# Patient Record
Sex: Male | Born: 1945 | Race: White | Hispanic: No | Marital: Married
Health system: Southern US, Community
[De-identification: ages and names within clinical notes are randomized; demographics above are authoritative.]

## PROBLEM LIST (undated history)

## (undated) DIAGNOSIS — I10 Essential (primary) hypertension: Secondary | ICD-10-CM

---

## 2021-04-18 ENCOUNTER — Emergency Department: Payer: Medicare Other

## 2021-04-18 ENCOUNTER — Other Ambulatory Visit: Payer: Self-pay

## 2021-04-18 ENCOUNTER — Emergency Department
Admission: EM | Admit: 2021-04-18 | Discharge: 2021-04-18 | Disposition: A | Discharge status: Home / Self Care | Payer: Medicare Other | Attending: Emergency Medicine | Admitting: Emergency Medicine

## 2021-04-18 DIAGNOSIS — Z7901 Long term (current) use of anticoagulants: Secondary | ICD-10-CM | POA: Diagnosis not present

## 2021-04-18 DIAGNOSIS — E860 Dehydration: Secondary | ICD-10-CM

## 2021-04-18 DIAGNOSIS — Z79899 Other long term (current) drug therapy: Secondary | ICD-10-CM | POA: Insufficient documentation

## 2021-04-18 DIAGNOSIS — I10 Essential (primary) hypertension: Secondary | ICD-10-CM | POA: Diagnosis not present

## 2021-04-18 DIAGNOSIS — R42 Dizziness and giddiness: Secondary | ICD-10-CM | POA: Diagnosis present

## 2021-04-18 DIAGNOSIS — I4891 Unspecified atrial fibrillation: Secondary | ICD-10-CM | POA: Insufficient documentation

## 2021-04-18 HISTORY — DX: Essential (primary) hypertension: I10

## 2021-04-18 LAB — BASIC METABOLIC PANEL
Anion gap: 11 (ref 5–15)
BUN: 25 mg/dL — ABNORMAL HIGH (ref 8–23)
CO2: 25 mmol/L (ref 22–32)
Calcium: 9.1 mg/dL (ref 8.9–10.3)
Chloride: 100 mmol/L (ref 98–111)
Creatinine, Ser: 1.51 mg/dL — ABNORMAL HIGH (ref 0.61–1.24)
GFR, Estimated: 48 mL/min — ABNORMAL LOW (ref >60)
Glucose, Bld: 104 mg/dL — ABNORMAL HIGH (ref 70–99)
Potassium: 4.1 mmol/L (ref 3.5–5.1)
Sodium: 136 mmol/L (ref 135–145)

## 2021-04-18 LAB — URINALYSIS, COMPLETE (UACMP) WITH MICROSCOPIC
Bacteria, UA: NONE SEEN
Bilirubin Urine: NEGATIVE
Glucose, UA: NEGATIVE mg/dL
Hgb urine dipstick: NEGATIVE
Ketones, ur: NEGATIVE mg/dL
Leukocytes,Ua: NEGATIVE
Nitrite: NEGATIVE
Protein, ur: NEGATIVE mg/dL
Specific Gravity, Urine: 1.002 — ABNORMAL LOW (ref 1.005–1.030)
Squamous Epithelial / HPF: NONE SEEN (ref 0–5)
pH: 6 (ref 5.0–8.0)

## 2021-04-18 LAB — CBC
HCT: 40.9 % (ref 39.0–52.0)
Hemoglobin: 14.3 g/dL (ref 13.0–17.0)
MCH: 33.3 pg (ref 26.0–34.0)
MCHC: 35 g/dL (ref 30.0–36.0)
MCV: 95.3 fL (ref 80.0–100.0)
Platelets: 153 10*3/uL (ref 150–400)
RBC: 4.29 MIL/uL (ref 4.22–5.81)
RDW: 12.1 % (ref 11.5–15.5)
WBC: 8.5 10*3/uL (ref 4.0–10.5)
nRBC: 0 % (ref 0.0–0.2)

## 2021-04-18 LAB — TROPONIN I (HIGH SENSITIVITY): Troponin I (High Sensitivity): 23 ng/L — ABNORMAL HIGH (ref <18)

## 2021-04-18 MED ORDER — IOHEXOL 350 MG/ML SOLN
75.0000 mL | Freq: Once | INTRAVENOUS | Status: AC | PRN
  Administered 2021-04-18: 75 mL via INTRAVENOUS

## 2021-04-18 MED ORDER — SODIUM CHLORIDE 0.9 % IV BOLUS
1000.0000 mL | Freq: Once | INTRAVENOUS | Status: AC
  Administered 2021-04-18: 1000 mL via INTRAVENOUS

## 2021-04-18 MED ORDER — APIXABAN (ELIQUIS) VTE STARTER PACK (10MG AND 5MG): ORAL_TABLET | ORAL | 0 refills | Status: AC

## 2021-04-18 MED ORDER — METOPROLOL SUCCINATE ER 25 MG PO TB24: 25.0000 mg | ORAL_TABLET | Freq: Every day | ORAL | 11 refills | Status: AC

## 2021-04-18 MED ORDER — APIXABAN (ELIQUIS) VTE STARTER PACK (10MG AND 5MG): ORAL_TABLET | ORAL | 0 refills | Status: DC

## 2021-04-18 NOTE — ED Triage Notes (Signed)
Pt comes via [facility] with c/o dizziness while playing softball. Pt states he was playing 1st base and the game had just ended. Pt states when he get dehydrated he sometimes gets blurry vision.  Pt states this also happens while practicing and playing softball.  Pt denies any CP or SOB.

## 2021-04-18 NOTE — ED Provider Notes (Signed)
[facility] Emergency Department Provider Note  ____________________________________________   Event Date/Time   First MD Initiated Contact with Patient 04/18/21 1549     (approximate)  I have reviewed the triage vital signs and the nursing notes.   HISTORY  Chief Complaint Dizziness    HPI Charles Lindsey is a 75 y.o. male presents the ED via EMS from a softball field.  Patient complains of dizziness while playing softball.  States he was playing first base in the game ended when he had a change in his vision.  States this is happened before when he is dehydrated.  States usually it will appear something like cotton and then if he is really dehydrated looks like pink cotton.  Patient states his heart rate was really high when EMS arrived, he has never had an episode of A. fib that he knows about.  Patient does have a history of carotid artery stenosis  Past Medical History:  Diagnosis Date   Hypertension     There are no problems to display for this patient.   History reviewed. No pertinent surgical history.  Prior to Admission medications   Medication Sig Start Date End Date Taking? Authorizing Provider  metoprolol succinate (TOPROL XL) 25 MG 24 hr tablet Take 1 tablet (25 mg total) by mouth daily. 04/18/21 04/18/22 Yes Olive Zmuda, Roselyn Bering, PA-C  APIXABAN Everlene Balls) VTE STARTER PACK (10MG  AND 5MG ) Take as directed on package: start with two-5mg  tablets twice daily for 7 days. On day 8, switch to one-5mg  tablet twice daily. 04/18/21   04/20/21, PA-C    Allergies Patient has no allergy information on record.  No family history on file.  Social History    Review of Systems  Constitutional: No fever/chills Eyes: No visual changes. ENT: No sore throat. Respiratory: Denies cough Cardiovascular: Denies chest pain Gastrointestinal: Denies abdominal pain Genitourinary: Negative for dysuria. Musculoskeletal: Negative for back pain. Skin: Negative  for rash. Psychiatric: no mood changes,     ____________________________________________   PHYSICAL EXAM:  VITAL SIGNS: ED Triage Vitals  Enc Vitals Group     BP 04/18/21 1358 116/82     Pulse Rate 04/18/21 1358 88     Resp 04/18/21 1358 18     Temp 04/18/21 1358 98 F (36.7 C)     Temp src --      SpO2 04/18/21 1358 98 %     Weight --      Height --      Head Circumference --      Peak Flow --      Pain Score 04/18/21 1358 0     Pain Loc --      Pain Edu? --      Excl. in GC? --     Constitutional: Alert and oriented. Well appearing and in no acute distress. Eyes: Conjunctivae are normal.  PERRL, EOMI, 3 beats nystagmus bilaterally Head: Atraumatic. Nose: No congestion/rhinnorhea. Mouth/Throat: Mucous membranes are moist.   Neck:  supple no lymphadenopathy noted Cardiovascular: Normal rate, regular rhythm. Heart sounds are normal Respiratory: Normal respiratory effort.  No retractions, lungs c t a  Abd: soft nontender bs normal all 4 quad GU: deferred Musculoskeletal: FROM all extremities, warm and well perfused Neurologic:  Normal speech and language.  Skin:  Skin is warm, dry and intact. No rash noted. Psychiatric: Mood and affect are normal. Speech and behavior are normal.  ____________________________________________   LABS (all labs ordered are listed, but only abnormal  results are displayed)  Labs Reviewed  BASIC METABOLIC PANEL - Abnormal; Notable for the following components:      Result Value   Glucose, Bld 104 (*)    BUN 25 (*)    Creatinine, Ser 1.51 (*)    GFR, Estimated 48 (*)    All other components within normal limits  URINALYSIS, COMPLETE (UACMP) WITH MICROSCOPIC - Abnormal; Notable for the following components:   Color, Urine STRAW (*)    APPearance CLEAR (*)    Specific Gravity, Urine 1.002 (*)    All other components within normal limits  TROPONIN I (HIGH SENSITIVITY) - Abnormal; Notable for the following components:   Troponin I  (High Sensitivity) 23 (*)    All other components within normal limits  CBC  CBG MONITORING, ED   ____________________________________________   ____________________________________________  RADIOLOGY  CT of the head  ____________________________________________   PROCEDURES  Procedure(s) performed: EKG, see physician read  Procedures    ____________________________________________   INITIAL IMPRESSION / ASSESSMENT AND PLAN / ED COURSE  Pertinent labs & imaging results that were available during my care of the patient were reviewed by me and considered in my medical decision making (see chart for details).   The patient 75 year old male presents with dizziness and questionable dehydration.  EMS states patient had A. fib on their EKG strip.  Physical exam shows patient appears very stable.  His heart rate has returned to normal.  EKG does not show A. fib.  DDx: Dehydration, TIA, CVA, A. Fib  CBC is normal, basic metabolic panel shows slight dehydration, urinalysis is normal, troponin is pending  CT of the head without contrast  EKG shows sinus rhythm, see physician read Ekg strips from EMS show afib rvr  Ct of the head reviewed by me confirmed by radiology to show an area of subactue ischemia  Consult with neurology, recommends cta head and neck , mri brain, if negative pt can go home, if positive will need admission and he will see on the floor tomorrow   CTA and MRI were negative for stroke  Consult with cardiology from [facility], states patient should be on metoprolol and Eliquis.  Patient already takes Toprol 50 mg daily, will give him a prescription for Eliquis  Patient was given discharge instructions to follow-up with cardiology.  They live in [place] [facility] [place] [facility] [place] so I did inform him if he wants to see a cardiologist in his area he should confer with his regular doctor.  He should still start the Eliquis.  He will most likely need a Holter  monitor.  He states he understands treatment plan.  They are agreeable to the treatment plan.  He was discharged stable condition.  Charles Lindsey was evaluated in Emergency Department on 04/18/2021 for the symptoms described in the history of present illness. He was evaluated in the context of the global COVID-19 pandemic, which necessitated consideration that the patient might be at risk for infection with the SARS-CoV-2 virus that causes COVID-19. Institutional protocols and algorithms that pertain to the evaluation of patients at risk for COVID-19 are in a state of rapid change based on information released by regulatory bodies including the CDC and federal and state organizations. These policies and algorithms were followed during the patient's care in the ED.    As part of my medical decision making, I reviewed the following data within the electronic MEDICAL RECORD NUMBER History obtained from family, Nursing notes reviewed and incorporated, Labs reviewed ,  EKG interpreted atrial fibrillation, Old chart reviewed, Radiograph reviewed , A consult was requested and obtained from this/these consultant(s) Neurology and cardiology, Evaluated by EM attending Dr. Larinda Buttery, Notes from prior ED visits, and [place] Controlled Substance Database  ____________________________________________   FINAL CLINICAL IMPRESSION(S) / ED DIAGNOSES  Final diagnoses:  Atrial fibrillation, unspecified type (HCC)  Dehydration      NEW MEDICATIONS STARTED DURING THIS VISIT:  New Prescriptions   APIXABAN (ELIQUIS) VTE STARTER PACK (10MG  AND 5MG )    Take as directed on package: start with two-5mg  tablets twice daily for 7 days. On day 8, switch to one-5mg  tablet twice daily.   METOPROLOL SUCCINATE (TOPROL XL) 25 MG 24 HR TABLET    Take 1 tablet (25 mg total) by mouth daily.     Note:  This document was prepared using Dragon voice recognition software and may include unintentional dictation errors.    ,  PA-C 04/18/21 Faythe Ghee, MD 04/18/21 [phone]

## 2021-04-18 NOTE — ED Triage Notes (Signed)
C/O dizziness and nausea while playing softball.  VS_ Initially Afib RVR  BP 80/  Converted to NSR 90. 138/86 500 c NS 18g Lfa CBG:  139

## 2021-04-18 NOTE — Discharge Instructions (Addendum)
Follow-up with your regular doctor.  Follow-up with cardiology.  Cardiologist that is on-call for [place] [facility] as listed above.  He is recommended that we start you on metoprolol 25 mg/day and Eliquis.  Eliquis is a anticoagulant that would prevent you from having a stroke due to the A. fib. It is important that you see a cardiologist.  I understand that you live in [place] and that you may want to see a physician in your area.  Please confer with your regular doctor to obtain a cardiologist

## 2021-04-18 NOTE — ED Notes (Signed)
Pt in CT, will assess when returned

## 2021-04-18 NOTE — ED Notes (Signed)
Ambulatory to restroom without difficulty.

## 2021-04-19 ENCOUNTER — Telehealth: Payer: Self-pay | Admitting: Physician Assistant

## 2021-04-19 MED ORDER — APIXABAN (ELIQUIS) VTE STARTER PACK (10MG AND 5MG): ORAL_TABLET | ORAL | 0 refills | Status: AC

## 2021-04-19 NOTE — Telephone Encounter (Cosign Needed)
Pt called and rx did not go through pharmacy, resent to the pharmacy

## 2022-07-01 IMAGING — CT CT HEAD W/O CM
3 series · 15 of 46 positions shown, 18 images · non-contrast
Comparison: None.

CLINICAL DATA: Dizziness while playing softball, initial encounter

EXAM:
CT HEAD WITHOUT CONTRAST
TECHNIQUE: Contiguous axial images were obtained from the base of the skull
through the vertex without intravenous contrast.

[Series 2: head wo · axial · 0.42mm/px · z∈[-102,+18]mm · 9 of 29 slices shown, 12 images]
[im 3/29  brain]
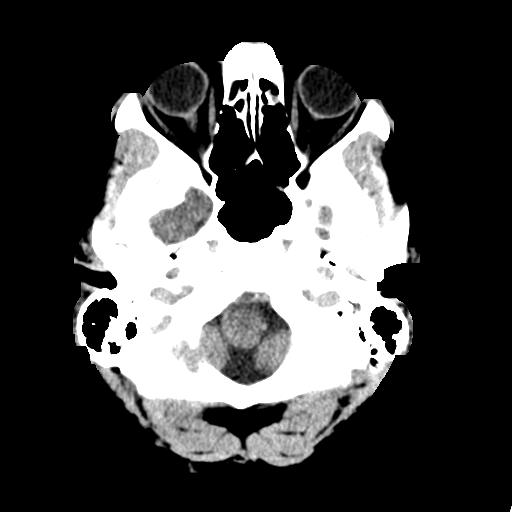
[im 3/29  bone]
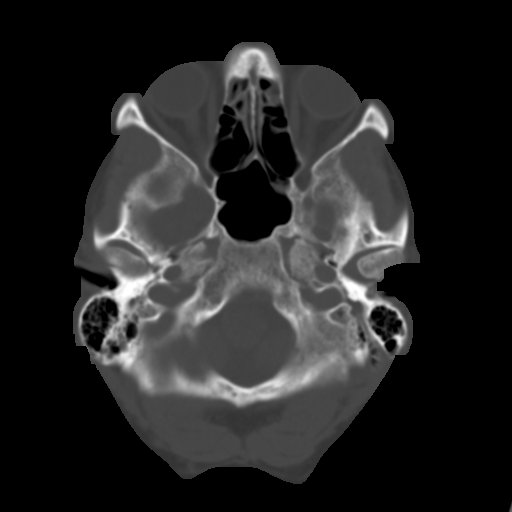
[im 6/29  brain]
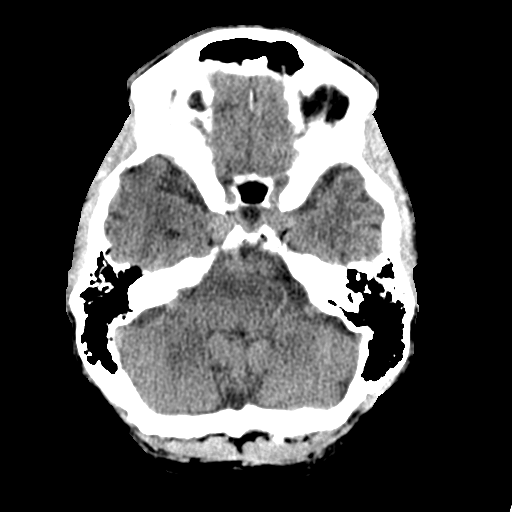
[im 9/29  brain]
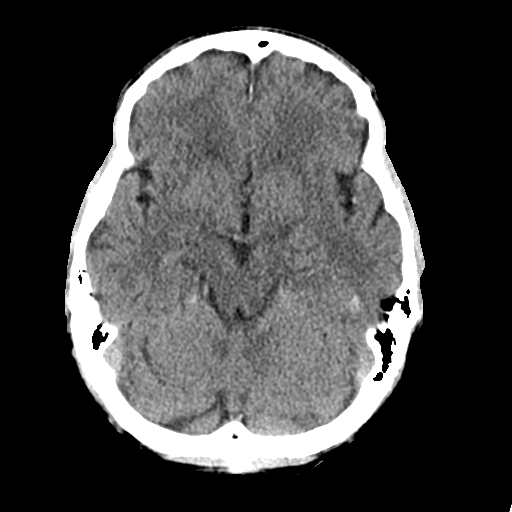
[im 12/29  brain]
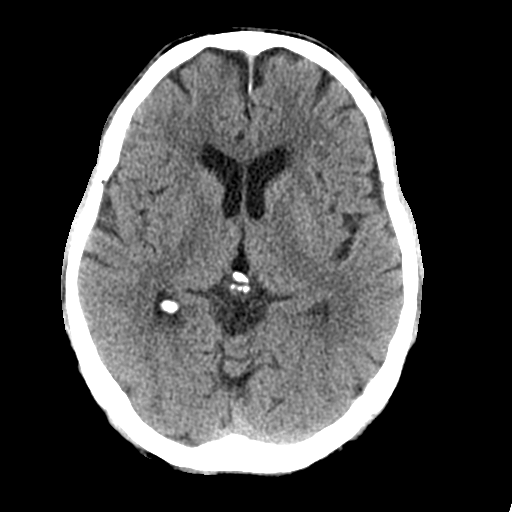
[im 15/29  brain]
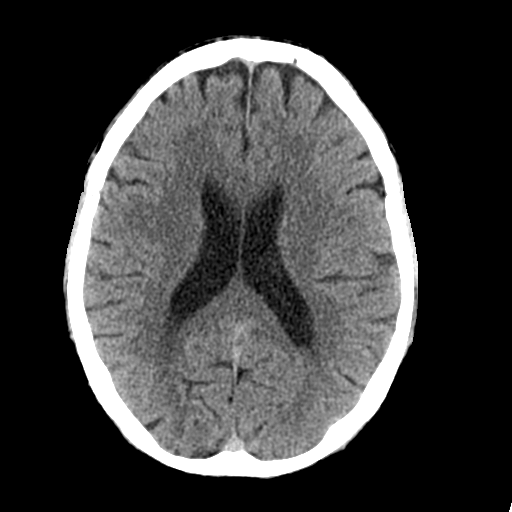
[im 15/29  bone]
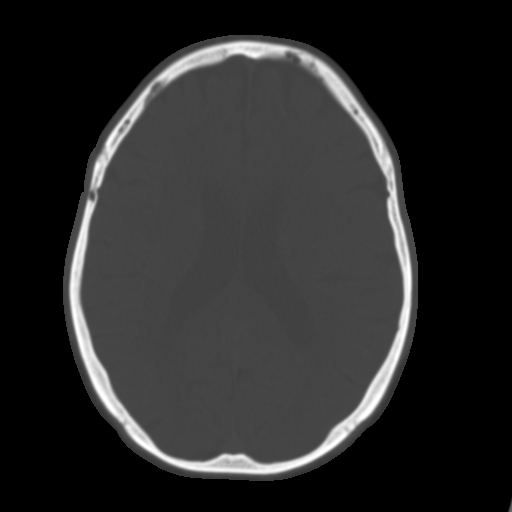
[im 18/29  brain]
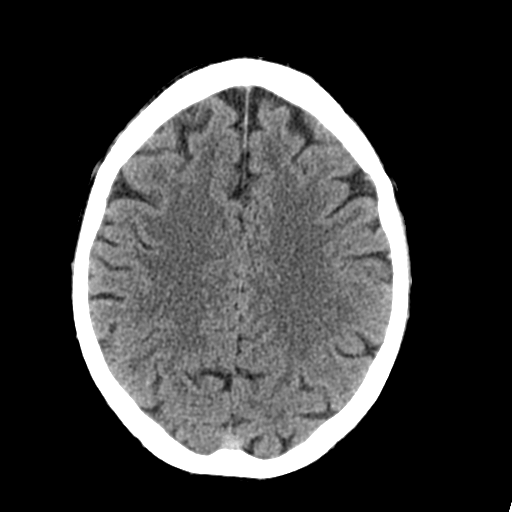
[im 21/29  brain]
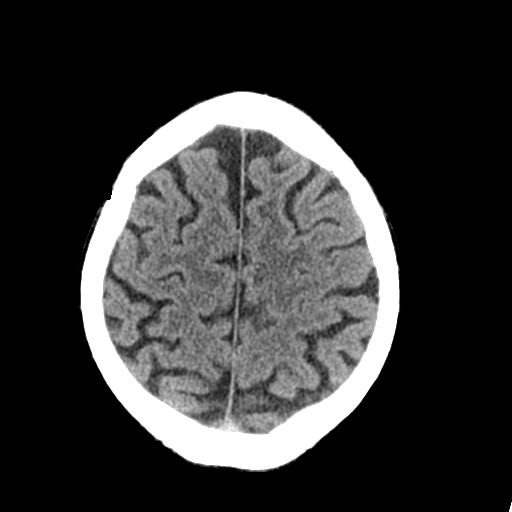
[im 24/29  brain]
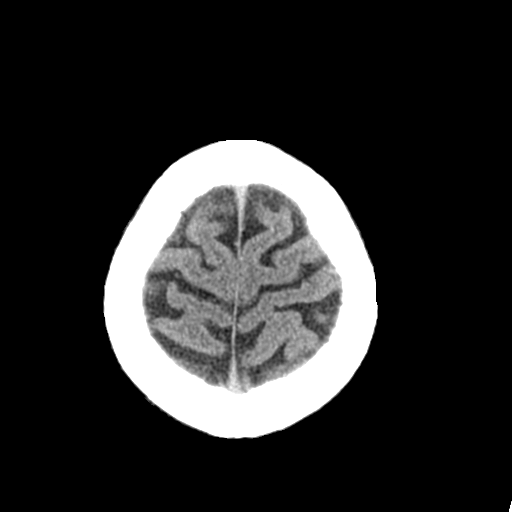
[im 27/29  brain]
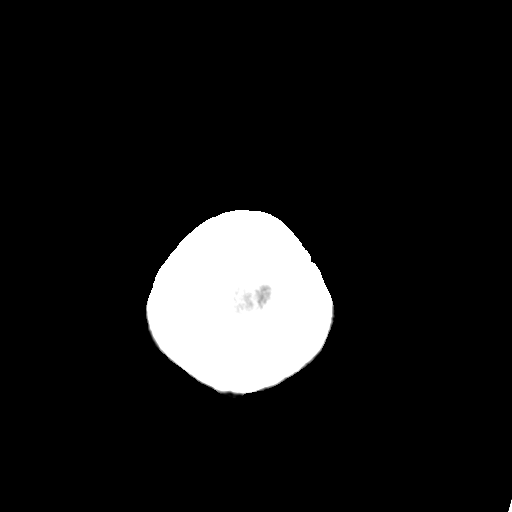
[im 27/29  bone]
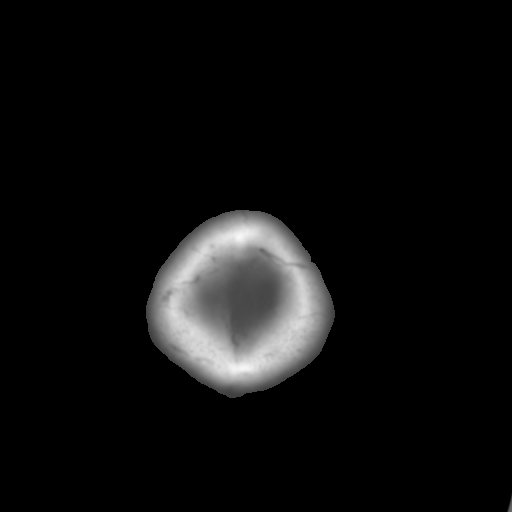

[Series 4: coronal soft tissue · coronal · 0.31mm/px · 3 of 66 slices shown]
[im 22/66  brain]
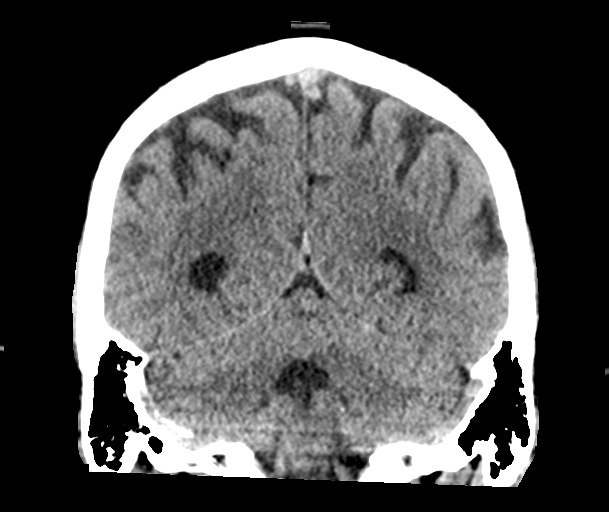
[im 29/66  brain]
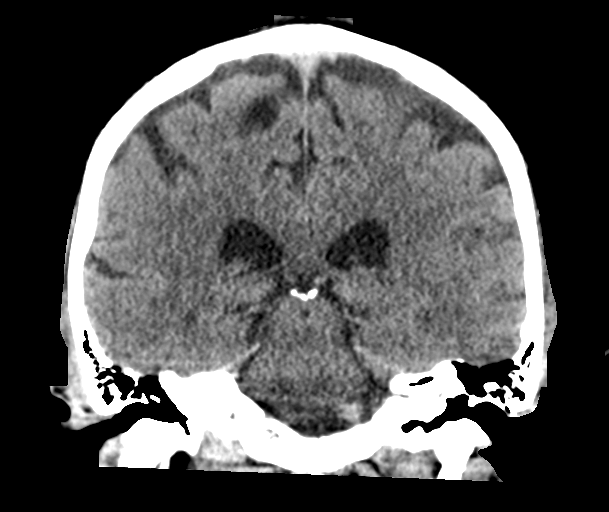
[im 37/66  brain]
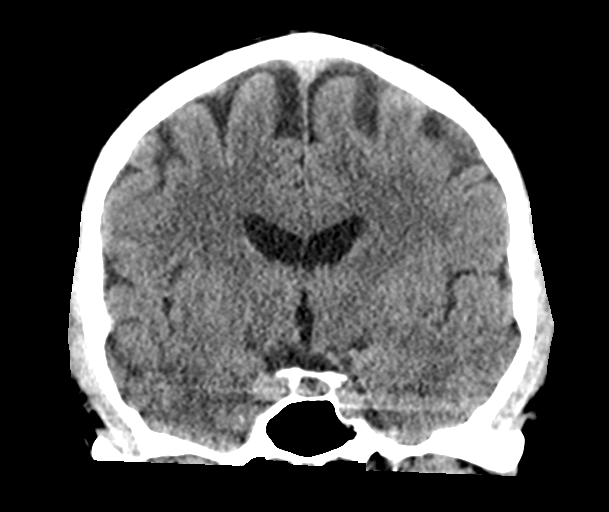

[Series 5: sagittal soft tissue · sagittal · 0.31mm/px · 3 of 57 slices shown]
[im 19/57  brain]
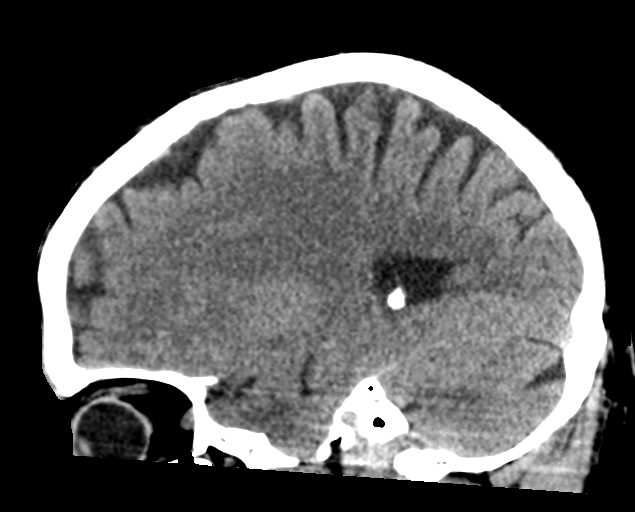
[im 29/57  brain]
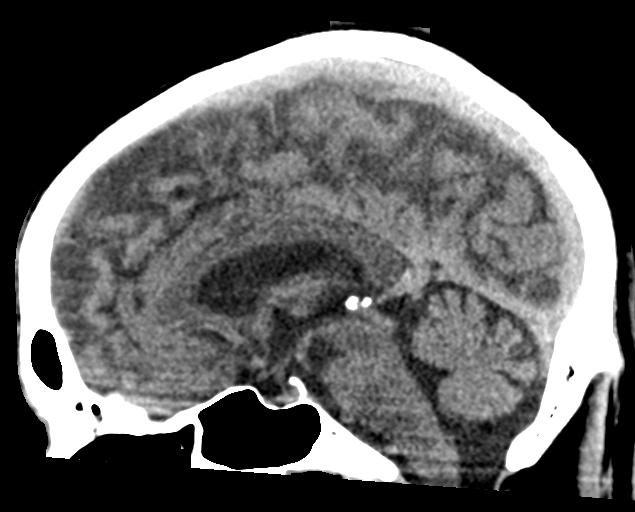
[im 38/57  brain]
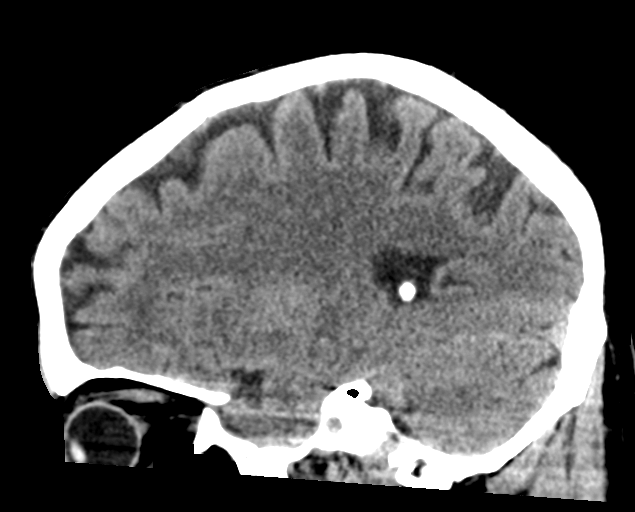

[15 of 46 positions shown; findings below may reference images not displayed]

FINDINGS: Brain: Rounded area of decreased attenuation is noted anterior limb
of the internal capsule on the right consistent with subacute
ischemia. No acute infarct or acute hemorrhage is seen. No other
focal abnormality is noted.

Vascular: No hyperdense vessel or unexpected calcification.

Skull: Normal. Negative for fracture or focal lesion.

Sinuses/Orbits: No acute finding.

Other: None.
IMPRESSION: Area of subacute ischemia in the anterior limb of the right internal
capsule.

No other focal abnormality is noted.
